# Patient Record
Sex: Male | Born: 1990 | Race: Black or African American | Hispanic: No | Marital: Single | Smoking: Never smoker
Health system: Southern US, Community
[De-identification: ages and names within clinical notes are randomized; demographics above are authoritative.]

---

## 2020-02-13 ENCOUNTER — Emergency Department (HOSPITAL_COMMUNITY): Payer: Medicaid Other

## 2020-02-13 ENCOUNTER — Other Ambulatory Visit: Payer: Self-pay

## 2020-02-13 ENCOUNTER — Emergency Department (HOSPITAL_COMMUNITY)
Admission: EM | Admit: 2020-02-13 | Discharge: 2020-02-13 | Disposition: A | Discharge status: Home / Self Care | Payer: Medicaid Other | Attending: Emergency Medicine | Admitting: Emergency Medicine

## 2020-02-13 DIAGNOSIS — F10129 Alcohol abuse with intoxication, unspecified: Secondary | ICD-10-CM | POA: Diagnosis not present

## 2020-02-13 DIAGNOSIS — R1031 Right lower quadrant pain: Secondary | ICD-10-CM | POA: Diagnosis present

## 2020-02-13 DIAGNOSIS — T1490XA Injury, unspecified, initial encounter: Secondary | ICD-10-CM

## 2020-02-13 DIAGNOSIS — F10920 Alcohol use, unspecified with intoxication, uncomplicated: Secondary | ICD-10-CM

## 2020-02-13 LAB — COMPREHENSIVE METABOLIC PANEL
ALT: 28 U/L (ref 0–44)
AST: 31 U/L (ref 15–41)
Albumin: 4 g/dL (ref 3.5–5.0)
Alkaline Phosphatase: 50 U/L (ref 38–126)
Anion gap: 15 (ref 5–15)
BUN: 8 mg/dL (ref 6–20)
CO2: 21 mmol/L — ABNORMAL LOW (ref 22–32)
Calcium: 8.9 mg/dL (ref 8.9–10.3)
Chloride: 106 mmol/L (ref 98–111)
Creatinine, Ser: 1.32 mg/dL — ABNORMAL HIGH (ref 0.61–1.24)
GFR calc Af Amer: >60 mL/min (ref >60)
GFR calc non Af Amer: >60 mL/min (ref >60)
Glucose, Bld: 108 mg/dL — ABNORMAL HIGH (ref 70–99)
Potassium: 3.1 mmol/L — ABNORMAL LOW (ref 3.5–5.1)
Sodium: 142 mmol/L (ref 135–145)
Total Bilirubin: 0.2 mg/dL — ABNORMAL LOW (ref 0.3–1.2)
Total Protein: 6.7 g/dL (ref 6.5–8.1)

## 2020-02-13 LAB — SAMPLE TO BLOOD BANK

## 2020-02-13 LAB — I-STAT CHEM 8, ED
BUN: 8 mg/dL (ref 6–20)
Calcium, Ion: 0.97 mmol/L — ABNORMAL LOW (ref 1.15–1.40)
Chloride: 107 mmol/L (ref 98–111)
Creatinine, Ser: 1.3 mg/dL — ABNORMAL HIGH (ref 0.61–1.24)
Glucose, Bld: 104 mg/dL — ABNORMAL HIGH (ref 70–99)
HCT: 37 % — ABNORMAL LOW (ref 39.0–52.0)
Hemoglobin: 12.6 g/dL — ABNORMAL LOW (ref 13.0–17.0)
Potassium: 3 mmol/L — ABNORMAL LOW (ref 3.5–5.1)
Sodium: 143 mmol/L (ref 135–145)
TCO2: 20 mmol/L — ABNORMAL LOW (ref 22–32)

## 2020-02-13 LAB — CBC
HCT: 36.5 % — ABNORMAL LOW (ref 39.0–52.0)
Hemoglobin: 11.6 g/dL — ABNORMAL LOW (ref 13.0–17.0)
MCH: 24.6 pg — ABNORMAL LOW (ref 26.0–34.0)
MCHC: 31.8 g/dL (ref 30.0–36.0)
MCV: 77.3 fL — ABNORMAL LOW (ref 80.0–100.0)
Platelets: 238 10*3/uL (ref 150–400)
RBC: 4.72 MIL/uL (ref 4.22–5.81)
RDW: 14.8 % (ref 11.5–15.5)
WBC: 6.3 10*3/uL (ref 4.0–10.5)
nRBC: 0 % (ref 0.0–0.2)

## 2020-02-13 LAB — ETHANOL: Alcohol, Ethyl (B): 169 mg/dL — ABNORMAL HIGH (ref <10)

## 2020-02-13 LAB — PROTIME-INR
INR: 1.1 (ref 0.8–1.2)
Prothrombin Time: 13.3 seconds (ref 11.4–15.2)

## 2020-02-13 LAB — LACTIC ACID, PLASMA: Lactic Acid, Venous: 3.9 mmol/L (ref 0.5–1.9)

## 2020-02-13 MED ORDER — LACTATED RINGERS IV SOLN
INTRAVENOUS | Status: AC | PRN
  Administered 2020-02-13: 999 mL via INTRAVENOUS

## 2020-02-13 MED ORDER — IBUPROFEN 400 MG PO TABS
600.0000 mg | ORAL_TABLET | Freq: Once | ORAL | Status: AC
  Administered 2020-02-13: 600 mg via ORAL
  Filled 2020-02-13: qty 1

## 2020-02-13 MED ORDER — ONDANSETRON HCL 4 MG/2ML IJ SOLN
INTRAMUSCULAR | Status: AC
  Administered 2020-02-13: 4 mg
  Filled 2020-02-13: qty 2

## 2020-02-13 MED ORDER — IOHEXOL 300 MG/ML  SOLN
100.0000 mL | Freq: Once | INTRAMUSCULAR | Status: AC | PRN
  Administered 2020-02-13: 100 mL via INTRAVENOUS

## 2020-02-13 NOTE — ED Notes (Signed)
Sherry Ruffing, mother, [phone] would like an update when available

## 2020-02-13 NOTE — ED Notes (Signed)
Pending result I-stat chem 8 for CT

## 2020-02-13 NOTE — ED Notes (Signed)
Pt confirmed RN could update pt's mother. RN updated mother Sherry Ruffing. Pt. Also requests GF.

## 2020-02-13 NOTE — ED Notes (Signed)
Pt nauseas at CT. Given zofran 4 mg verbal order
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
## 2020-02-13 NOTE — ED Notes (Signed)
Informed CT I-stat Chem 8 ran by Malachi Bonds NT BUN 8 CRT 1.3.
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
## 2020-05-09 ENCOUNTER — Emergency Department (HOSPITAL_BASED_OUTPATIENT_CLINIC_OR_DEPARTMENT_OTHER)
Admission: EM | Admit: 2020-05-09 | Discharge: 2020-05-09 | Disposition: A | Discharge status: Home / Self Care | Payer: Medicaid Other | Attending: Emergency Medicine | Admitting: Emergency Medicine

## 2020-05-09 ENCOUNTER — Encounter (HOSPITAL_BASED_OUTPATIENT_CLINIC_OR_DEPARTMENT_OTHER): Payer: Self-pay | Admitting: *Deleted

## 2020-05-09 ENCOUNTER — Other Ambulatory Visit: Payer: Self-pay

## 2020-05-09 DIAGNOSIS — R1110 Vomiting, unspecified: Secondary | ICD-10-CM | POA: Diagnosis present

## 2020-05-09 DIAGNOSIS — Z5321 Procedure and treatment not carried out due to patient leaving prior to being seen by health care provider: Secondary | ICD-10-CM | POA: Diagnosis not present

## 2020-05-09 LAB — COMPREHENSIVE METABOLIC PANEL
ALT: 21 U/L (ref 0–44)
AST: 24 U/L (ref 15–41)
Albumin: 5 g/dL (ref 3.5–5.0)
Alkaline Phosphatase: 63 U/L (ref 38–126)
Anion gap: 15 (ref 5–15)
BUN: 11 mg/dL (ref 6–20)
CO2: 22 mmol/L (ref 22–32)
Calcium: 9.8 mg/dL (ref 8.9–10.3)
Chloride: 106 mmol/L (ref 98–111)
Creatinine, Ser: 1.11 mg/dL (ref 0.61–1.24)
GFR, Estimated: >60 mL/min (ref >60)
Glucose, Bld: 127 mg/dL — ABNORMAL HIGH (ref 70–99)
Potassium: 3.7 mmol/L (ref 3.5–5.1)
Sodium: 143 mmol/L (ref 135–145)
Total Bilirubin: 0.5 mg/dL (ref 0.3–1.2)
Total Protein: 8.4 g/dL — ABNORMAL HIGH (ref 6.5–8.1)

## 2020-05-09 LAB — CBC
HCT: 42.1 % (ref 39.0–52.0)
Hemoglobin: 13.8 g/dL (ref 13.0–17.0)
MCH: 25.4 pg — ABNORMAL LOW (ref 26.0–34.0)
MCHC: 32.8 g/dL (ref 30.0–36.0)
MCV: 77.5 fL — ABNORMAL LOW (ref 80.0–100.0)
Platelets: 275 10*3/uL (ref 150–400)
RBC: 5.43 MIL/uL (ref 4.22–5.81)
RDW: 14.3 % (ref 11.5–15.5)
WBC: 10.2 10*3/uL (ref 4.0–10.5)
nRBC: 0 % (ref 0.0–0.2)

## 2020-05-09 LAB — LIPASE, BLOOD: Lipase: 27 U/L (ref 11–51)

## 2020-05-09 MED ORDER — ONDANSETRON 4 MG PO TBDP
4.0000 mg | ORAL_TABLET | Freq: Once | ORAL | Status: AC | PRN
  Administered 2020-05-09: 4 mg via ORAL
  Filled 2020-05-09: qty 1

## 2020-05-09 NOTE — ED Notes (Signed)
Pt keeps stating he feels like he is going to pass out. Labs ordered. Wait times explained to pt.
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
## 2020-05-10 NOTE — ED Notes (Signed)
Called x 3 no answer 

## 2020-10-17 ENCOUNTER — Emergency Department (HOSPITAL_COMMUNITY): Payer: No Typology Code available for payment source

## 2020-10-17 ENCOUNTER — Other Ambulatory Visit: Payer: Self-pay

## 2020-10-17 ENCOUNTER — Emergency Department (HOSPITAL_COMMUNITY)
Admission: EM | Admit: 2020-10-17 | Discharge: 2020-10-18 | Disposition: A | Discharge status: Home / Self Care | Payer: No Typology Code available for payment source | Attending: Emergency Medicine | Admitting: Emergency Medicine

## 2020-10-17 DIAGNOSIS — F10929 Alcohol use, unspecified with intoxication, unspecified: Secondary | ICD-10-CM | POA: Insufficient documentation

## 2020-10-17 DIAGNOSIS — U071 COVID-19: Secondary | ICD-10-CM | POA: Insufficient documentation

## 2020-10-17 DIAGNOSIS — Y92410 Unspecified street and highway as the place of occurrence of the external cause: Secondary | ICD-10-CM | POA: Insufficient documentation

## 2020-10-17 DIAGNOSIS — Y908 Blood alcohol level of 240 mg/100 ml or more: Secondary | ICD-10-CM | POA: Insufficient documentation

## 2020-10-17 DIAGNOSIS — F10920 Alcohol use, unspecified with intoxication, uncomplicated: Secondary | ICD-10-CM

## 2020-10-17 DIAGNOSIS — R4182 Altered mental status, unspecified: Secondary | ICD-10-CM | POA: Insufficient documentation

## 2020-10-17 DIAGNOSIS — R451 Restlessness and agitation: Secondary | ICD-10-CM | POA: Diagnosis present

## 2020-10-17 LAB — COMPREHENSIVE METABOLIC PANEL
ALT: 22 U/L (ref 0–44)
AST: 32 U/L (ref 15–41)
Albumin: 4.6 g/dL (ref 3.5–5.0)
Alkaline Phosphatase: 53 U/L (ref 38–126)
Anion gap: 12 (ref 5–15)
BUN: 8 mg/dL (ref 6–20)
CO2: 22 mmol/L (ref 22–32)
Calcium: 8.8 mg/dL — ABNORMAL LOW (ref 8.9–10.3)
Chloride: 104 mmol/L (ref 98–111)
Creatinine, Ser: 1.11 mg/dL (ref 0.61–1.24)
GFR, Estimated: >60 mL/min (ref >60)
Glucose, Bld: 111 mg/dL — ABNORMAL HIGH (ref 70–99)
Potassium: 3 mmol/L — ABNORMAL LOW (ref 3.5–5.1)
Sodium: 138 mmol/L (ref 135–145)
Total Bilirubin: 0.2 mg/dL — ABNORMAL LOW (ref 0.3–1.2)
Total Protein: 7.6 g/dL (ref 6.5–8.1)

## 2020-10-17 LAB — CBC
HCT: 38.9 % — ABNORMAL LOW (ref 39.0–52.0)
Hemoglobin: 12.9 g/dL — ABNORMAL LOW (ref 13.0–17.0)
MCH: 26 pg (ref 26.0–34.0)
MCHC: 33.2 g/dL (ref 30.0–36.0)
MCV: 78.3 fL — ABNORMAL LOW (ref 80.0–100.0)
Platelets: 270 10*3/uL (ref 150–400)
RBC: 4.97 MIL/uL (ref 4.22–5.81)
RDW: 14.6 % (ref 11.5–15.5)
WBC: 9.6 10*3/uL (ref 4.0–10.5)
nRBC: 0 % (ref 0.0–0.2)

## 2020-10-17 LAB — I-STAT CHEM 8, ED
BUN: 9 mg/dL (ref 6–20)
Calcium, Ion: 1.02 mmol/L — ABNORMAL LOW (ref 1.15–1.40)
Chloride: 101 mmol/L (ref 98–111)
Creatinine, Ser: 1.5 mg/dL — ABNORMAL HIGH (ref 0.61–1.24)
Glucose, Bld: 108 mg/dL — ABNORMAL HIGH (ref 70–99)
HCT: 43 % (ref 39.0–52.0)
Hemoglobin: 14.6 g/dL (ref 13.0–17.0)
Potassium: 2.9 mmol/L — ABNORMAL LOW (ref 3.5–5.1)
Sodium: 141 mmol/L (ref 135–145)
TCO2: 24 mmol/L (ref 22–32)

## 2020-10-17 LAB — PROTIME-INR
INR: 1 (ref 0.8–1.2)
Prothrombin Time: 12.9 seconds (ref 11.4–15.2)

## 2020-10-17 LAB — ETHANOL: Alcohol, Ethyl (B): 267 mg/dL — ABNORMAL HIGH (ref <10)

## 2020-10-17 LAB — SAMPLE TO BLOOD BANK

## 2020-10-17 LAB — LACTIC ACID, PLASMA: Lactic Acid, Venous: 3.3 mmol/L (ref 0.5–1.9)

## 2020-10-17 MED ORDER — IOHEXOL 300 MG/ML  SOLN
100.0000 mL | Freq: Once | INTRAMUSCULAR | Status: AC | PRN
  Administered 2020-10-17: 100 mL via INTRAVENOUS

## 2020-10-17 MED ORDER — POTASSIUM CHLORIDE 10 MEQ/100ML IV SOLN: 10.0000 meq | Freq: Once | INTRAVENOUS | Status: DC
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
## 2020-10-18 LAB — URINALYSIS, ROUTINE W REFLEX MICROSCOPIC
Bilirubin Urine: NEGATIVE
Glucose, UA: NEGATIVE mg/dL
Hgb urine dipstick: NEGATIVE
Ketones, ur: NEGATIVE mg/dL
Leukocytes,Ua: NEGATIVE
Nitrite: NEGATIVE
Protein, ur: NEGATIVE mg/dL
Specific Gravity, Urine: 1.02 (ref 1.005–1.030)
pH: 6 (ref 5.0–8.0)

## 2020-10-18 LAB — RAPID URINE DRUG SCREEN, HOSP PERFORMED
Amphetamines: NOT DETECTED
Barbiturates: NOT DETECTED
Benzodiazepines: POSITIVE — AB
Cocaine: NOT DETECTED
Opiates: NOT DETECTED
Tetrahydrocannabinol: POSITIVE — AB

## 2020-10-18 LAB — RESP PANEL BY RT-PCR (FLU A&B, COVID) ARPGX2
Influenza A by PCR: NEGATIVE
Influenza B by PCR: NEGATIVE
SARS Coronavirus 2 by RT PCR: POSITIVE — AB
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
## 2020-10-18 NOTE — Discharge Instructions (Signed)
Imaging today all negative-- no signs of internal injuries. See attached.

## 2020-10-18 NOTE — ED Notes (Signed)
Pt standing up at the end of the bed voiding on the floor. Pt was provided with urinal and was redirected back into the bed. MD into assess.

## 2020-10-19 ENCOUNTER — Telehealth: Payer: Self-pay

## 2020-10-19 ENCOUNTER — Telehealth: Payer: Self-pay | Admitting: Adult Health

## 2020-10-19 NOTE — Telephone Encounter (Signed)
Called to discuss with patient about COVID-19 symptoms and the use of one of the available treatments for those with mild to moderate Covid symptoms and at a high risk of hospitalization.  Pt appears to qualify for outpatient treatment due to co-morbid conditions and/or a member of an at-risk group in accordance with the FDA Emergency Use Authorization.   Unable to reach pt - Introduced myself on the phone and call ended unexpectedly.  Called back with no answer.   Noreene Filbert

## 2020-10-19 NOTE — Telephone Encounter (Signed)
Called to discuss with patient about COVID-19 symptoms and the use of one of the available treatments for those with mild to moderate Covid symptoms and at a high risk of hospitalization.  Pt appears to qualify for outpatient treatment due to co-morbid conditions and/or a member of an at-risk group in accordance with the FDA Emergency Use Authorization.    Symptom onset: Unknown Vaccinated: Unknown Booster? Unknown Immunocompromised? No Qualifiers: None  Unable to reach pt - Left message with mother.  Esther Hardy

## 2020-10-20 ENCOUNTER — Telehealth: Payer: Self-pay

## 2020-10-20 NOTE — Telephone Encounter (Signed)
Called to discuss with patient about COVID-19 symptoms and the use of one of the available treatments for those with mild to moderate Covid symptoms and at a high risk of hospitalization.  Pt appears to qualify for outpatient treatment due to co-morbid conditions and/or a member of an at-risk group in accordance with the FDA Emergency Use Authorization.    Unable to reach pt - Message left with mother and call back number [phone].  Esther Hardy
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
## 2023-02-02 ENCOUNTER — Emergency Department (HOSPITAL_BASED_OUTPATIENT_CLINIC_OR_DEPARTMENT_OTHER)
Admission: EM | Admit: 2023-02-02 | Discharge: 2023-02-03 | Disposition: A | Discharge status: Home / Self Care | Payer: Medicaid Other | Attending: Emergency Medicine | Admitting: Emergency Medicine

## 2023-02-02 ENCOUNTER — Other Ambulatory Visit: Payer: Self-pay

## 2023-02-02 ENCOUNTER — Encounter (HOSPITAL_BASED_OUTPATIENT_CLINIC_OR_DEPARTMENT_OTHER): Payer: Self-pay | Admitting: Emergency Medicine

## 2023-02-02 DIAGNOSIS — K2920 Alcoholic gastritis without bleeding: Secondary | ICD-10-CM | POA: Diagnosis not present

## 2023-02-02 DIAGNOSIS — R112 Nausea with vomiting, unspecified: Secondary | ICD-10-CM | POA: Diagnosis present

## 2023-02-02 NOTE — ED Triage Notes (Signed)
Patient with nausea and vomiting since this morning.  He states that he had some alcohol last night and states that he can't keep anything down.

## 2023-02-03 LAB — COMPREHENSIVE METABOLIC PANEL
ALT: 44 U/L (ref 0–44)
AST: 60 U/L — ABNORMAL HIGH (ref 15–41)
Albumin: 4.7 g/dL (ref 3.5–5.0)
Alkaline Phosphatase: 72 U/L (ref 38–126)
Anion gap: 13 (ref 5–15)
BUN: 14 mg/dL (ref 6–20)
CO2: 23 mmol/L (ref 22–32)
Calcium: 9.2 mg/dL (ref 8.9–10.3)
Chloride: 105 mmol/L (ref 98–111)
Creatinine, Ser: 1.13 mg/dL (ref 0.61–1.24)
GFR, Estimated: >60 mL/min (ref >60)
Glucose, Bld: 108 mg/dL — ABNORMAL HIGH (ref 70–99)
Potassium: 3.8 mmol/L (ref 3.5–5.1)
Sodium: 141 mmol/L (ref 135–145)
Total Bilirubin: 0.7 mg/dL (ref 0.3–1.2)
Total Protein: 8.6 g/dL — ABNORMAL HIGH (ref 6.5–8.1)

## 2023-02-03 LAB — CBC WITH DIFFERENTIAL/PLATELET
Abs Immature Granulocytes: 0.05 10*3/uL (ref 0.00–0.07)
Basophils Absolute: 0.1 10*3/uL (ref 0.0–0.1)
Basophils Relative: 0 %
Eosinophils Absolute: 0 10*3/uL (ref 0.0–0.5)
Eosinophils Relative: 0 %
HCT: 40.4 % (ref 39.0–52.0)
Hemoglobin: 13.3 g/dL (ref 13.0–17.0)
Immature Granulocytes: 0 %
Lymphocytes Relative: 10 %
Lymphs Abs: 1.3 10*3/uL (ref 0.7–4.0)
MCH: 25 pg — ABNORMAL LOW (ref 26.0–34.0)
MCHC: 32.9 g/dL (ref 30.0–36.0)
MCV: 75.9 fL — ABNORMAL LOW (ref 80.0–100.0)
Monocytes Absolute: 0.3 10*3/uL (ref 0.1–1.0)
Monocytes Relative: 3 %
Neutro Abs: 11.2 10*3/uL — ABNORMAL HIGH (ref 1.7–7.7)
Neutrophils Relative %: 87 %
Platelets: 323 10*3/uL (ref 150–400)
RBC: 5.32 MIL/uL (ref 4.22–5.81)
RDW: 15.2 % (ref 11.5–15.5)
WBC: 12.9 10*3/uL — ABNORMAL HIGH (ref 4.0–10.5)
nRBC: 0 % (ref 0.0–0.2)

## 2023-02-03 LAB — LIPASE, BLOOD: Lipase: 25 U/L (ref 11–51)

## 2023-02-03 MED ORDER — ONDANSETRON 4 MG PO TBDP: 4.0000 mg | ORAL_TABLET | Freq: Three times a day (TID) | ORAL | 0 refills | Status: AC | PRN

## 2023-02-03 MED ORDER — PANTOPRAZOLE SODIUM 40 MG IV SOLR
40.0000 mg | Freq: Once | INTRAVENOUS | Status: AC
  Administered 2023-02-03: 40 mg via INTRAVENOUS
  Filled 2023-02-03: qty 10

## 2023-02-03 MED ORDER — LACTATED RINGERS IV BOLUS
1000.0000 mL | Freq: Once | INTRAVENOUS | Status: AC
  Administered 2023-02-03: 1000 mL via INTRAVENOUS

## 2023-02-03 MED ORDER — ONDANSETRON HCL 4 MG/2ML IJ SOLN
4.0000 mg | Freq: Once | INTRAMUSCULAR | Status: AC
  Administered 2023-02-03: 4 mg via INTRAVENOUS
  Filled 2023-02-03: qty 2

## 2023-02-03 NOTE — ED Provider Notes (Signed)
[facility]  Provider Note  CSN: [id number] Arrival date & time: 02/02/23 2347  History Chief Complaint  Patient presents with   Emesis    Randall Conrad. is a 32 y.o. male with no significant PMH here with sister. He reports he has had nausea and vomiting all day long today, some lower abdominal soreness when vomiting but otherwise no abdominal pain. He reports he saw a small amount of blood in emesis once, but otherwise no hematemesis or melena. He states he drank some alcohol last night and did not eat anything and suspects this may be the cause of his symptoms.    Home Medications Prior to Admission medications   Medication Sig Start Date End Date Taking? Authorizing Provider  ondansetron (ZOFRAN-ODT) 4 MG disintegrating tablet Take 1 tablet (4 mg total) by mouth every 8 (eight) hours as needed for nausea or vomiting. 02/03/23  Yes Pollyann Savoy, MD     Allergies    Patient has no known allergies.   Review of Systems   Review of Systems Please see HPI for pertinent positives and negatives  Physical Exam BP (!) 142/91 (BP Location: Right Arm)   Pulse 92   Temp 97.6 F (36.4 C) (Oral)   Resp 17   SpO2 100%   Physical Exam Vitals and nursing note reviewed.  Constitutional:      Appearance: Normal appearance.  HENT:     Head: Normocephalic and atraumatic.     Nose: Nose normal.     Mouth/Throat:     Mouth: Mucous membranes are moist.  Eyes:     Extraocular Movements: Extraocular movements intact.     Conjunctiva/sclera: Conjunctivae normal.  Cardiovascular:     Rate and Rhythm: Normal rate.  Pulmonary:     Effort: Pulmonary effort is normal.     Breath sounds: Normal breath sounds.  Abdominal:     General: Abdomen is flat.     Palpations: Abdomen is soft.     Tenderness: There is no abdominal tenderness. There is no guarding.  Musculoskeletal:        General: No swelling. Normal range of motion.      Cervical back: Neck supple.  Skin:    General: Skin is warm and dry.  Neurological:     General: No focal deficit present.     Mental Status: He is alert.  Psychiatric:        Mood and Affect: Mood normal.     ED Results / Procedures / Treatments   EKG None  Procedures Procedures  Medications Ordered in the ED Medications  ondansetron (ZOFRAN) injection 4 mg (4 mg Intravenous Given 02/03/23 0035)  lactated ringers bolus 1,000 mL (1,000 mLs Intravenous New Bag/Given 02/03/23 0039)  pantoprazole (PROTONIX) injection 40 mg (40 mg Intravenous Given 02/03/23 0037)    Initial Impression and Plan  Patient with nausea and vomiting, possibly a mild gastritis from EtOH use last night. Vitals and exam are reassuring. Will check labs, give IVF, PPI and Zofran for symptoms.   ED Course   Clinical Course as of 02/03/23 0205  Sat Feb 03, 2023  0053 CBC with mild leukocytosis, otherwise normal.  [CS]  0101 CMP with mildly increased AST, otherwise unremarkable. Lipase is normal.  [CS]  0137 Patient reports he is feeling better. Will give PO trial while IVF are finishing.  [CS]  0204 Patient tolerating PO fluids, ready to go home. Rx for Zofran, advance  diet as tolerated. PCP follow up, RTED for any other concerns.   [CS]    Clinical Course User Index [CS] Pollyann Savoy, MD     MDM Rules/Calculators/A&P Medical Decision Making Problems Addressed: Acute alcoholic gastritis without hemorrhage: acute illness or injury  Amount and/or Complexity of Data Reviewed Labs: ordered. Decision-making details documented in ED Course.  Risk Prescription drug management.     Final Clinical Impression(s) / ED Diagnoses Final diagnoses:  Acute alcoholic gastritis without hemorrhage    Rx / DC Orders ED Discharge Orders          Ordered    ondansetron (ZOFRAN-ODT) 4 MG disintegrating tablet  Every 8 hours PRN        02/03/23 0205             Pollyann Savoy, MD 02/03/23  [phone]
